# Patient Record
Sex: Female | Born: 1990 | Hispanic: No | Marital: Married | Smoking: Never smoker
Health system: Southern US, Community
[De-identification: ages and names within clinical notes are randomized; demographics above are authoritative.]

## PROBLEM LIST (undated history)

## (undated) DIAGNOSIS — Z975 Presence of (intrauterine) contraceptive device: Secondary | ICD-10-CM

## (undated) DIAGNOSIS — K2970 Gastritis, unspecified, without bleeding: Secondary | ICD-10-CM

## (undated) DIAGNOSIS — N926 Irregular menstruation, unspecified: Secondary | ICD-10-CM

## (undated) HISTORY — PX: OTHER SURGICAL HISTORY: SHX169

## (undated) HISTORY — DX: Irregular menstruation, unspecified: N92.6

## (undated) HISTORY — DX: Presence of (intrauterine) contraceptive device: Z97.5

## (undated) HISTORY — DX: Gastritis, unspecified, without bleeding: K29.70

## (undated) HISTORY — PX: CHOLECYSTECTOMY: SHX55

---

## 2012-12-11 ENCOUNTER — Other Ambulatory Visit: Payer: Self-pay | Admitting: Emergency Medicine

## 2012-12-11 ENCOUNTER — Ambulatory Visit (INDEPENDENT_AMBULATORY_CARE_PROVIDER_SITE_OTHER): Payer: Self-pay

## 2012-12-11 DIAGNOSIS — Z021 Encounter for pre-employment examination: Secondary | ICD-10-CM

## 2012-12-11 DIAGNOSIS — Z0289 Encounter for other administrative examinations: Secondary | ICD-10-CM

## 2013-11-29 IMAGING — CR DG CHEST 2V
2 series · 2 of 2 positions shown · non-contrast
Comparison: None.

CLINICAL DATA: Pre employment evaluation.  Positive PPD last year.

CHEST - 2 VIEW

[view not recorded (1 of 2)]
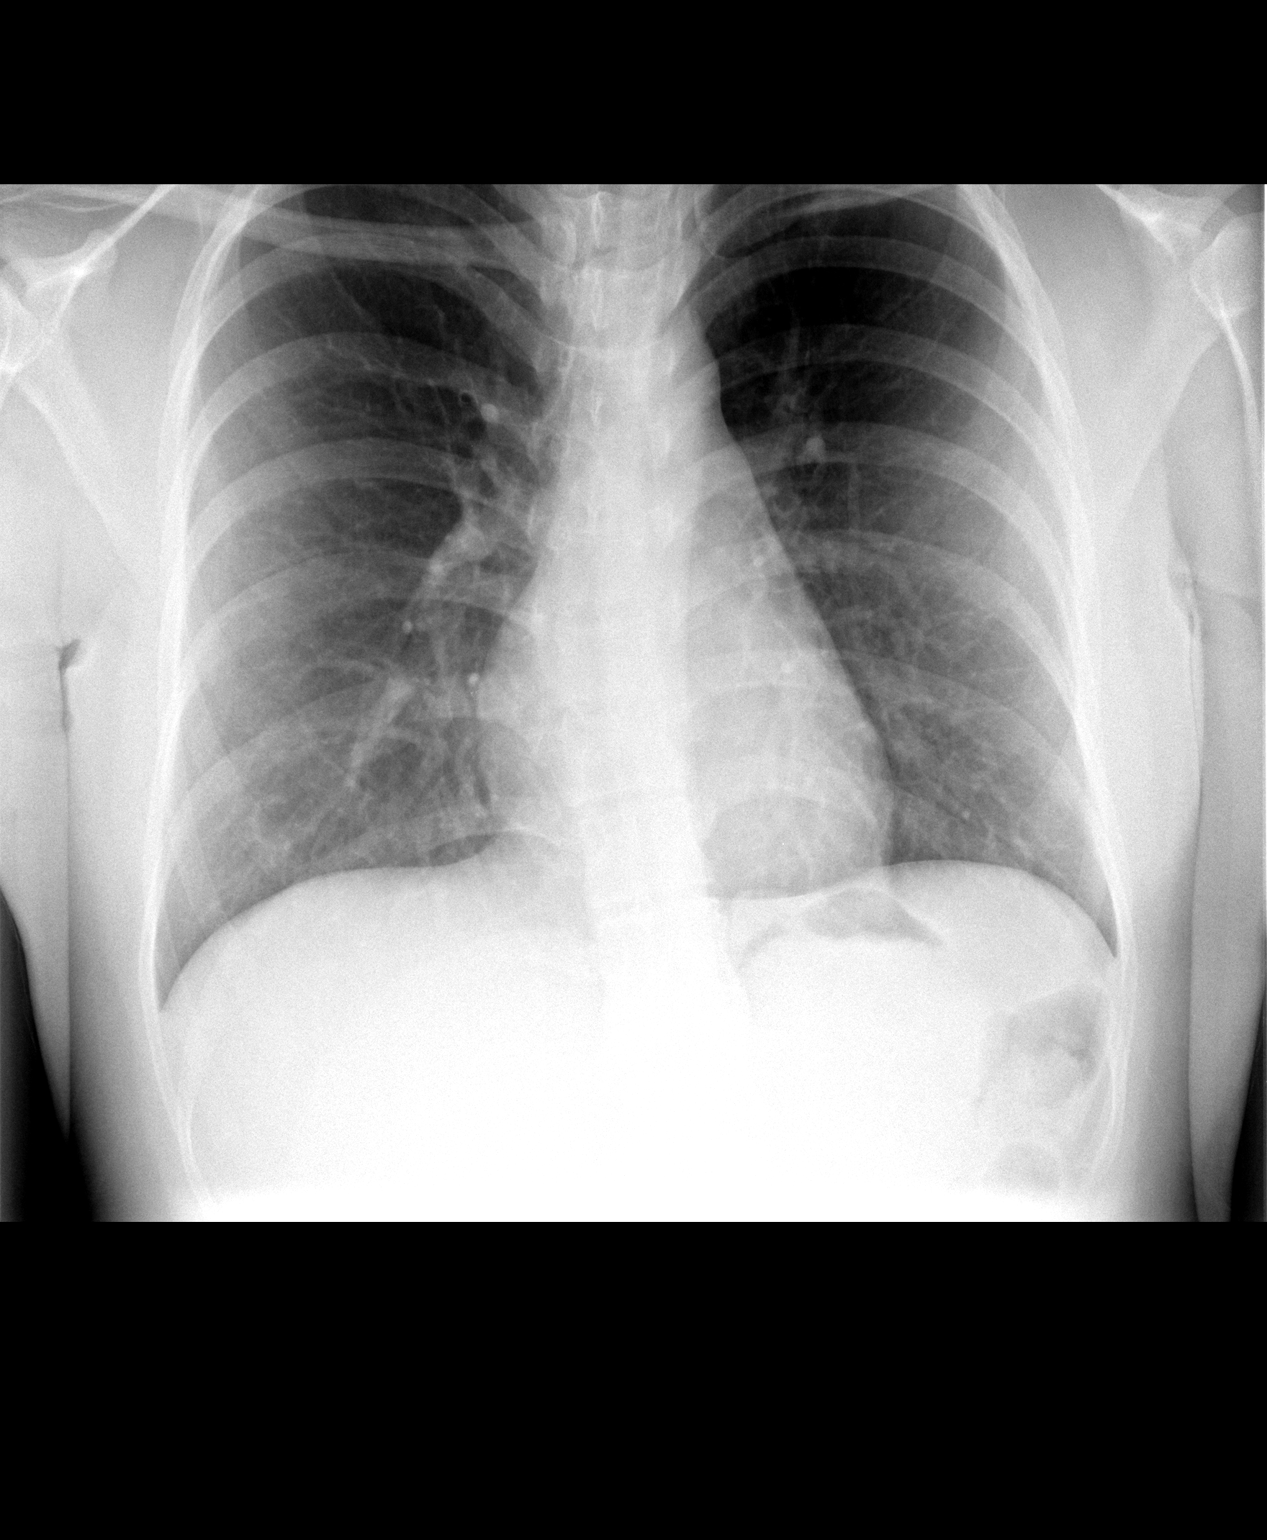

[view not recorded (2 of 2)]
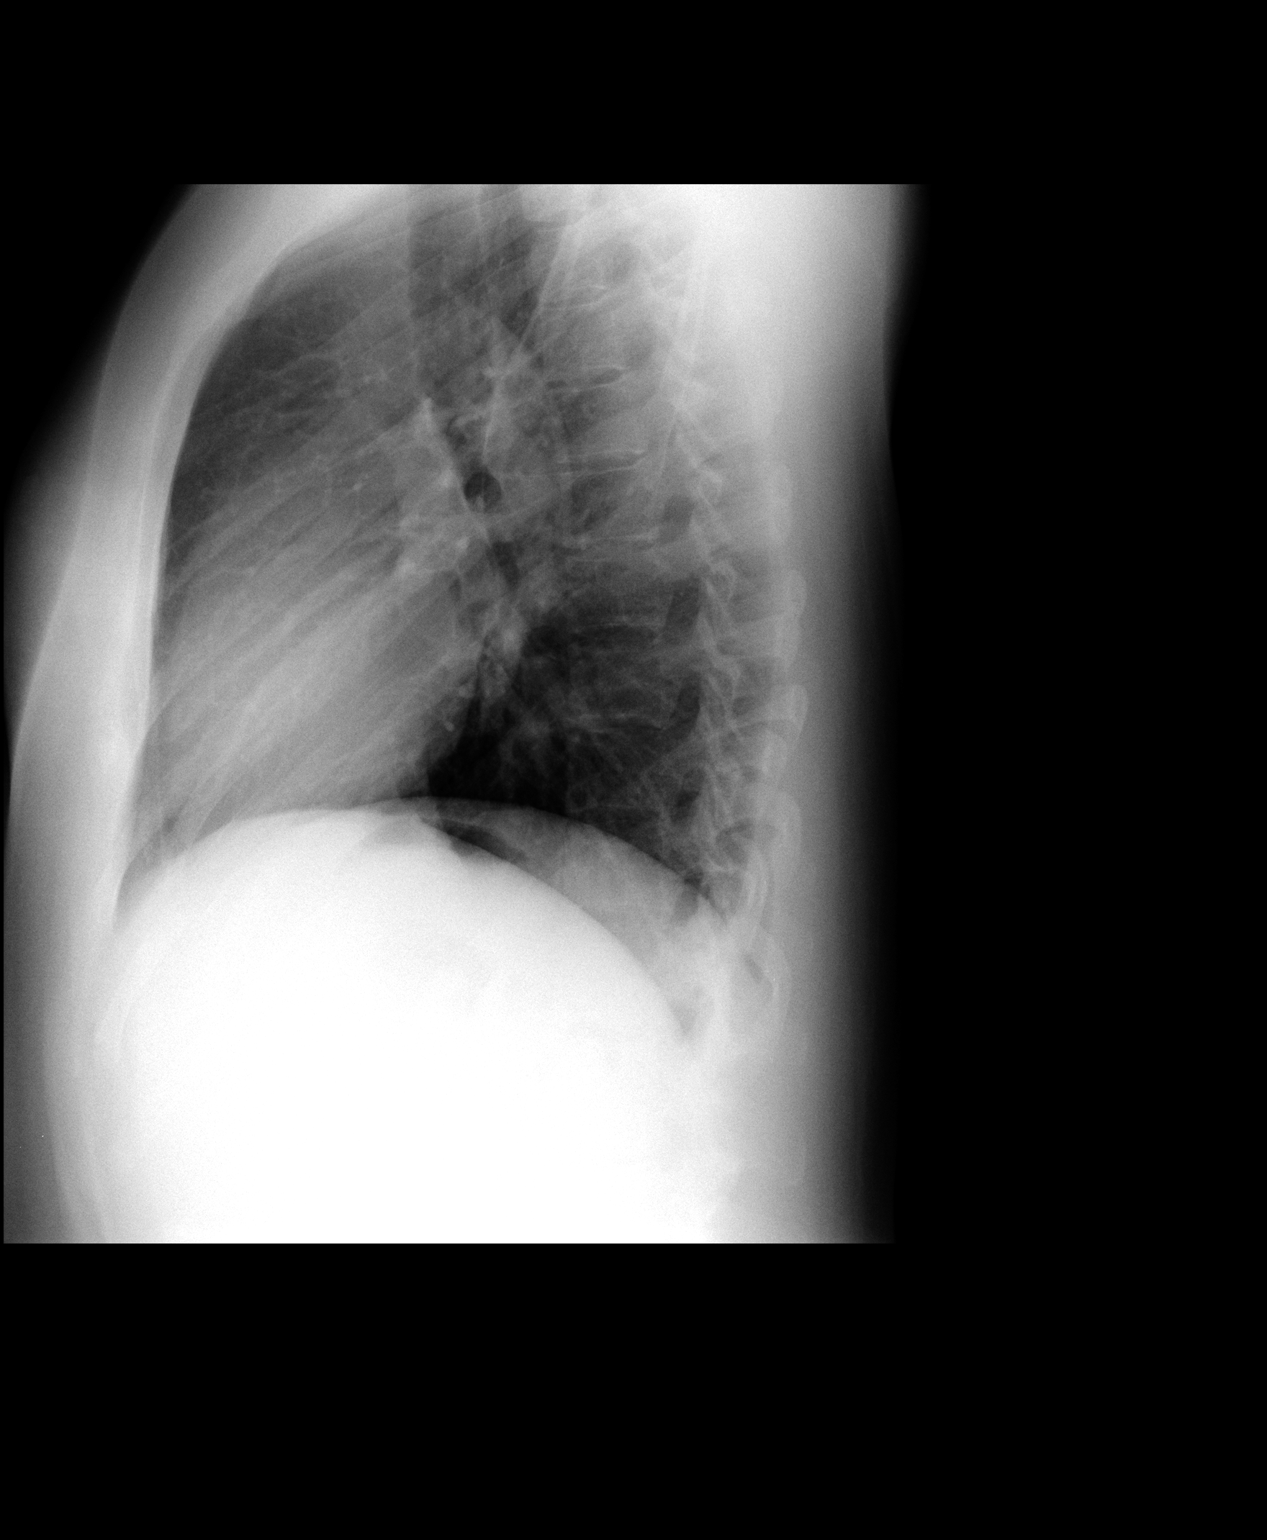

[2 of 2 positions shown; findings below may reference images not displayed]

FINDINGS: Normal sized heart.  Clear lungs.  Mild scoliosis.
IMPRESSION: No acute abnormality.  No evidence of active or previous
tuberculosis infection.

## 2021-11-13 ENCOUNTER — Encounter: Payer: Self-pay | Admitting: Family Medicine

## 2021-11-13 ENCOUNTER — Ambulatory Visit (INDEPENDENT_AMBULATORY_CARE_PROVIDER_SITE_OTHER): Payer: Commercial Managed Care - PPO | Admitting: Family Medicine

## 2021-11-13 VITALS — BP 112/77 | HR 80 | Temp 97.0°F | Ht 65.5 in | Wt 155.4 lb

## 2021-11-13 DIAGNOSIS — Z7689 Persons encountering health services in other specified circumstances: Secondary | ICD-10-CM

## 2021-11-13 DIAGNOSIS — K2970 Gastritis, unspecified, without bleeding: Secondary | ICD-10-CM | POA: Diagnosis not present

## 2021-11-13 DIAGNOSIS — Z3046 Encounter for surveillance of implantable subdermal contraceptive: Secondary | ICD-10-CM | POA: Insufficient documentation

## 2021-11-13 NOTE — Assessment & Plan Note (Signed)
Referral to OB/GYN

## 2021-11-13 NOTE — Assessment & Plan Note (Signed)
Chronic, mild, intermittent Controlled with OTC medications.

## 2021-11-13 NOTE — Patient Instructions (Signed)
It was very nice to see you today!  We are referring for Nexplanon change.  PLEASE NOTE:  If you had any lab tests please let us know if you have not heard back within a few days. You may see your results on mychart before we have a chance to review them but we will give you a call once they are reviewed by [place]. If we ordered any referrals today, please let us know if you have not heard from their office within the next week.   Please try these tips to maintain a healthy lifestyle:  Eat at least 3 REAL meals and 1-2 snacks per day.  Aim for no more than 5 hours between eating.  If you eat breakfast, please do so within one hour of getting up.   Each meal should contain half fruits/vegetables, one quarter protein, and one quarter carbs (no bigger than a computer mouse)  Cut down on sweet beverages. This includes juice, soda, and sweet tea.   Drink at least 1 glass of water with each meal and aim for at least 8 glasses per day  Exercise at least 150 minutes every week.

## 2021-11-13 NOTE — Progress Notes (Deleted)
   Vanessa Hughes is a 31 y.o. female who presents today for an office visit.  Assessment/Plan:  New/Acute Problems: ***  Chronic Problems Addressed Today: No problem-specific Assessment & Plan notes found for this encounter.     Subjective:  HPI:  ***        Objective:  Physical Exam: BP 112/77 (BP Location: Right Arm, Patient Position: Sitting, Cuff Size: Normal)   Pulse 80   Temp (!) 97 F (36.1 C) (Temporal)   Ht 5' 5.5" (1.664 m)   Wt 155 lb 6.4 oz (70.5 kg)   SpO2 97%   BMI 25.47 kg/m   Gen: No acute distress, resting comfortably*** CV: Regular rate and rhythm with no murmurs appreciated Pulm: Normal work of breathing, clear to auscultation bilaterally with no crackles, wheezes, or rhonchi Neuro: Grossly normal, moves all extremities Psych: Normal affect and thought content      Garner Nash, MD, MS

## 2021-11-13 NOTE — Progress Notes (Signed)
New Patient Office Visit  Subjective    Patient ID: Vanessa Hughes, female    DOB: [birth year]  Age: 31 y.o. MRN: [id number]  CC:  Chief Complaint  Patient presents with   Establish Care    Np est care. No concerns    HPI Vanessa Hughes presents to establish care.  Patient with history of gallstones and significant gastritis.  This was evaluated in 2014.  Patient with normal HIDA scan at that time.  Also had EGD that showed irregular appearing gastric tissue, presumably gastritis, however do not have pathology report.  CT abdomen showed trace physiologic pelvic fluid, not concerning for pathology.  After patient had lap chole, reports only mild, intermittent abdominal pain abdominal pain.  Does not take any medications for this.  Patient with Nexplanon.  It was implanted in 2020, she is currently on replacement.  She would like referral to OB/GYN.  Also set history of irregular menses, she also takes additional OCPs for this.  Is also been prescribed by OB/GYN in the past.  Patient reports seeing OB/GYN on 05/2021, can see part of this note, patient did have a normal Pap smear in 2014, reports having normal Pap smear in 2022 was well  Denies any symptoms of irregular menses at this moment, chest pain, shortness of breath, abdominal pain, nausea, vomiting. Review of systems negative    No outpatient encounter medications on file as of 11/13/2021.   No facility-administered encounter medications on file as of 11/13/2021.    Past Medical History:  Diagnosis Date   Gastritis    Irregular periods    Nexplanon in place    2020 in July    Past Surgical History:  Procedure Laterality Date   CHOLECYSTECTOMY      Family History  Problem Relation Age of Onset   Diabetes Maternal Grandmother     Social History   Socioeconomic History   Marital status: Married    Spouse name: Not on file   Number of children: Not on file   Years of education: Not on file    Highest education level: Not on file  Occupational History   Occupation: Travel Nurse  Tobacco Use   Smoking status: Never   Smokeless tobacco: Never  Vaping Use   Vaping Use: Never used  Substance and Sexual Activity   Alcohol use: Yes    Comment: 2 drinks a month   Drug use: Never   Sexual activity: Yes  Other Topics Concern   Not on file  Social History Narrative   Married 10 years 2023, Molli Hazard   Arts administrator, currently in [place]   Social Determinants of Health   Financial Resource Strain: Not on [employer] Insecurity: Not on file  Transportation Needs: Not on file  Physical Activity: Not on file  Stress: Not on file  Social Connections: Not on file  Intimate Partner Violence: Not on file          Objective    BP 112/77 (BP Location: Right Arm, Patient Position: Sitting, Cuff Size: Normal)   Pulse 80   Temp (!) 97 F (36.1 C) (Temporal)   Ht 5' 5.5" (1.664 m)   Wt 155 lb 6.4 oz (70.5 kg)   SpO2 97%   BMI 25.47 kg/m   Physical Exam      Assessment & Plan:   Problem List Items Addressed This Visit   None Visit Diagnoses     Encounter for routine child health examination without  abnormal findings    -  Primary   Encounter for surveillance of Nexplanon subdermal contraceptive       Relevant Orders   Ambulatory referral to Obstetrics / Gynecology      Spent 30 minutes reviewing patient chart, discussing past medical history and ongoing care needs.  At today's visit, we discussed treatment options, associated risk and benefits, and engage in counseling as needed.  Additionally the following were reviewed: Past medical records, past medical and surgical history, family and social background, as well as relevant laboratory results, imaging findings, and specialty notes, where applicable.  This message was generated using dictation software, and as a result, it may contain unintentional typos or errors.  Nevertheless, extensive effort was made to accurately  convey at the pertinent aspects of the patient visit.    There may have been are other unrelated non-urgent complaints, but due to the busy schedule and the amount of time already spent with her, time does not permit to address these issues at today's visit. Another appointment may have or has been requested to review these additional issues.   Return in about 6 months (around 05/15/2022) for Annual physical.   Garnette Gunner, MD

## 2021-12-25 ENCOUNTER — Encounter: Payer: Self-pay | Admitting: Family Medicine

## 2021-12-25 NOTE — Telephone Encounter (Signed)
Spoke with patient and she was able to get an appointment in August with OBGYN

## 2021-12-25 NOTE — Telephone Encounter (Signed)
Will need to be seen different days. We can go ahead and schedule the removal now so she has the appt booked

## 2022-01-08 ENCOUNTER — Ambulatory Visit (INDEPENDENT_AMBULATORY_CARE_PROVIDER_SITE_OTHER): Payer: Commercial Managed Care - PPO | Admitting: Radiology

## 2022-01-08 ENCOUNTER — Other Ambulatory Visit (HOSPITAL_COMMUNITY)
Admission: RE | Admit: 2022-01-08 | Discharge: 2022-01-08 | Disposition: A | Discharge status: Home / Self Care | Payer: Commercial Managed Care - PPO | Source: Ambulatory Visit | Attending: Radiology | Admitting: Radiology

## 2022-01-08 ENCOUNTER — Encounter: Payer: Self-pay | Admitting: Radiology

## 2022-01-08 VITALS — BP 112/78 | Ht 65.0 in | Wt 151.0 lb

## 2022-01-08 DIAGNOSIS — Z3041 Encounter for surveillance of contraceptive pills: Secondary | ICD-10-CM

## 2022-01-08 DIAGNOSIS — Z01419 Encounter for gynecological examination (general) (routine) without abnormal findings: Secondary | ICD-10-CM

## 2022-01-08 MED ORDER — BLISOVI 24 FE 1-20 MG-MCG(24) PO TABS: 1.0000 | ORAL_TABLET | Freq: Every day | ORAL | 4 refills | Status: AC

## 2022-01-08 NOTE — Progress Notes (Signed)
   Vanessa Hughes [facility] 07/15/1990 [id number]   History:  31 y.o. G0 presents for annual exam. Nexplanon due to be removed next week. Would like OCPs instead, currently taking OCPs for BTB with  Nexplanon anyway. No gyn concerns.   Gynecologic History No LMP recorded. Patient has had an implant. Period Pattern: (!) Irregular Contraception/Family planning: OCP (estrogen/progesterone) and Nexplanon Sexually active: yes Last Pap: 2020. Results were: normal Last mammogram: n/a.   Obstetric History OB History  Gravida Para Term Preterm AB Living  0 0 0 0 0 0  SAB IAB Ectopic Multiple Live Births  0 0 0 0 0     The following portions of the patient's history were reviewed and updated as appropriate: allergies, current medications, past family history, past medical history, past social history, past surgical history, and problem list.  Review of Systems Pertinent items noted in HPI and remainder of comprehensive ROS otherwise negative.   Past medical history, past surgical history, family history and social history were all reviewed and documented in the EPIC chart.   Exam:  Vitals:   01/08/22 1118  BP: 112/78  Weight: 151 lb (68.5 kg)  Height: 5\' 5"  (1.651 m)   Body mass index is 25.13 kg/m.  General appearance:  Normal Thyroid:  Symmetrical, normal in size, without palpable masses or nodularity. Respiratory  Auscultation:  Clear without wheezing or rhonchi Cardiovascular  Auscultation:  Regular rate, without rubs, murmurs or gallops  Edema/varicosities:  Not grossly evident Abdominal  Soft,nontender, without masses, guarding or rebound.  Liver/spleen:  No organomegaly noted  Hernia:  None appreciated  Skin  Inspection:  Grossly normal Breasts: Examined lying and sitting.   Right: Without masses, retractions, nipple discharge or axillary adenopathy.   Left: Without masses, retractions, nipple discharge or axillary adenopathy. Genitourinary   Inguinal/mons:   Normal without inguinal adenopathy  External genitalia:  Normal appearing vulva with no masses, tenderness, or lesions  BUS/Urethra/Skene's glands:  Normal without masses or exudate  Vagina:  Normal appearing with normal color and discharge, no lesions  Cervix:  Normal appearing without discharge or lesions  Uterus:  Normal in size, shape and contour.  Mobile, nontender  Adnexa/parametria:     Rt: Normal in size, without masses or tenderness.   Lt: Normal in size, without masses or tenderness.  Anus and perineum: Normal   Patient informed chaperone available to be present for breast and pelvic exam. Patient has requested no chaperone to be present. Patient has been advised what will be completed during breast and pelvic exam.   Assessment/Plan:   1. Well woman exam with routine gynecological exam  - Cytology - PAP( [place])  2. Oral contraceptive pill surveillance - keep appt for Nexplanon removal 01/11/22 - BLISOVI 24 FE 1-20 MG-MCG(24) tablet; Take 1 tablet by mouth daily.  Dispense: 84 tablet; Refill: 4     Discussed SBE,pap and mammogram screening as directed/appropriate. Recommend 03/13/22 of exercise weekly, including weight bearing exercise. Encouraged the use of seatbelts and sunscreen. Return in 1 year for annual or as needed.   B WHNP-BC 11:38 AM 01/08/2022

## 2022-01-11 ENCOUNTER — Ambulatory Visit (INDEPENDENT_AMBULATORY_CARE_PROVIDER_SITE_OTHER): Payer: Commercial Managed Care - PPO | Admitting: Radiology

## 2022-01-11 ENCOUNTER — Ambulatory Visit: Payer: Commercial Managed Care - PPO | Admitting: Radiology

## 2022-01-11 ENCOUNTER — Encounter: Payer: Self-pay | Admitting: Radiology

## 2022-01-11 VITALS — BP 120/82

## 2022-01-11 DIAGNOSIS — Z3046 Encounter for surveillance of implantable subdermal contraceptive: Secondary | ICD-10-CM | POA: Diagnosis not present

## 2022-01-11 NOTE — Progress Notes (Signed)
   31 y.o. G0P0000 female presents for Nexplanon removal.  Reason for removal end of 3 years.  She has decided to use OCPs for future contraception.  Procedure, risks and benefits have all been explained.     After all questions were answered, consent was obtained.    Past Medical History:  Diagnosis Date   Gastritis    Irregular periods    Nexplanon in place    2020 in July    Past Surgical History:  Procedure Laterality Date   CHOLECYSTECTOMY     nexplanon     inserted 12/2018 left arm    Current Outpatient Medications on File Prior to Visit  Medication Sig Dispense Refill   BLISOVI 24 FE 1-20 MG-MCG(24) tablet Take 1 tablet by mouth daily. 84 tablet 4   etonogestrel (NEXPLANON) 68 MG IMPL implant 1 each by Subdermal route once. Inserted 12/2018 left arm     No current facility-administered medications on file prior to visit.   Allergies  Allergen Reactions   Shrimp Extract Allergy Skin Test     Vitals:   01/11/22 1327  BP: 120/82   Physical Exam  Procedure: Patient placed supine on exam table with her left arm flexed at the elbow. The prior insertion site was located and the Nexplanon rod was palpated.  Area cleansed with Betadine x 3 and draped in normal sterile fashion.  Insertion site and surrounding tissue anesthetized with 1% Lidocaine without epinephrine, 2cc total used.  Small incision made with #11 blade.  Nexplanon removed without difficulty.  Steri-strips were applied and pressure dressing placed over the site.  Entire procedure performed with sterile technique.  Pt tolerated procedure well. Chaperone, Raynelle Fanning, CMA was present for the entirety of the procedure  Assessment: Nexplanon removal  Plan:  Post procedure instructions reviewed with pt.  Questions answered.  Pt knows to call with any concerns or questions.  New contraception:  OCP

## 2022-01-13 LAB — CYTOLOGY - PAP
Comment: NEGATIVE
Diagnosis: NEGATIVE
High risk HPV: NEGATIVE

## 2023-04-20 ENCOUNTER — Telehealth: Payer: Self-pay | Admitting: Family Medicine

## 2023-04-20 NOTE — Telephone Encounter (Signed)
LVM notifying pt to give [place] a call back at the office to sch a follow up appt as she is past due for one.
